# Patient Record
Sex: Male | Born: 2000 | Race: Black or African American | Hispanic: No | Marital: Single | Smoking: Current some day smoker
Health system: Southern US, Community
[De-identification: ages and names within clinical notes are randomized; demographics above are authoritative.]

## PROBLEM LIST (undated history)

## (undated) DIAGNOSIS — R32 Unspecified urinary incontinence: Secondary | ICD-10-CM

## (undated) DIAGNOSIS — J45909 Unspecified asthma, uncomplicated: Secondary | ICD-10-CM

## (undated) DIAGNOSIS — J302 Other seasonal allergic rhinitis: Secondary | ICD-10-CM

## (undated) DIAGNOSIS — F913 Oppositional defiant disorder: Secondary | ICD-10-CM

## (undated) DIAGNOSIS — R51 Headache: Secondary | ICD-10-CM

## (undated) DIAGNOSIS — F909 Attention-deficit hyperactivity disorder, unspecified type: Secondary | ICD-10-CM

## (undated) DIAGNOSIS — Y249XXA Unspecified firearm discharge, undetermined intent, initial encounter: Principal | ICD-10-CM

## (undated) HISTORY — DX: Headache: R51

## (undated) HISTORY — DX: Unspecified urinary incontinence: R32

## (undated) HISTORY — DX: Oppositional defiant disorder: F91.3

## (undated) HISTORY — PX: HUMERUS SURGERY: SHX672

## (undated) HISTORY — DX: Attention-deficit hyperactivity disorder, unspecified type: F90.9

---
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
## 2020-01-17 ENCOUNTER — Ambulatory Visit (INDEPENDENT_AMBULATORY_CARE_PROVIDER_SITE_OTHER): Payer: BC Managed Care – PPO | Admitting: Pediatrics

## 2020-01-17 ENCOUNTER — Other Ambulatory Visit: Payer: Self-pay

## 2020-01-17 ENCOUNTER — Telehealth (INDEPENDENT_AMBULATORY_CARE_PROVIDER_SITE_OTHER): Payer: BC Managed Care – PPO | Admitting: Pediatrics

## 2020-01-17 VITALS — BP 122/68 | HR 76 | Temp 98.3°F | Ht 71.0 in | Wt 144.8 lb

## 2020-01-17 DIAGNOSIS — R1013 Epigastric pain: Secondary | ICD-10-CM

## 2020-01-17 DIAGNOSIS — J4540 Moderate persistent asthma, uncomplicated: Secondary | ICD-10-CM | POA: Diagnosis not present

## 2020-01-17 MED ORDER — ALBUTEROL SULFATE HFA 108 (90 BASE) MCG/ACT IN AERS: INHALATION_SPRAY | RESPIRATORY_TRACT | 1 refills | Status: DC

## 2020-01-17 NOTE — Progress Notes (Signed)
Subjective:     John [name], is a 19 y.o. male   History provider by patient No interpreter necessary.  Chief Complaint  Patient presents with  . Abdominal Pain    x 3 days denies diarrhea and fever   . Emesis    x 3 days denies cough  . Medication Refill    HPI:  From earlier video visit:  "Has been having abdominal pain after eating spicy foods. Says the pain is in the middle of his stomach. Describes it as a cramping pain. Usually happens an hour after eating spicy foods. Doesn't happen after eating other foods. The pain will last for around 4 hours after he eats. He vomited at work 3 days ago. Says he likes eating John [name] food and uses a lot of hot sauce. Denies cough, fever, diarrhea, rhinorrhea. Says his throat has been sore as well over the past few days"  Patient did not have any additional information to provide during our in-person visit. Affirmed that the abdominal pain only occurs after eating spicy foods. Denied need for STI testing, had been previously tested in 2020. Asked if he could get a COVID swab and for his albuterol to be refilled.    Review of Systems  Constitutional: Negative for appetite change, fever and unexpected weight change.  HENT: Positive for sore throat. Negative for congestion, rhinorrhea and trouble swallowing.   Respiratory: Negative for cough and choking.   Gastrointestinal: Negative for abdominal pain, diarrhea, nausea and vomiting.  Genitourinary: Negative for difficulty urinating, hematuria, penile pain and testicular pain.  Skin: Negative for rash.     Patient's history was reviewed and updated as appropriate: allergies, current medications, past medical history and problem list.     Objective:     BP 122/68 (BP Location: Right Arm, Patient Position: Sitting)   Pulse 76   Temp 98.3 F (36.8 C) (Temporal)   Ht 5\' 11"  (1.803 m)   Wt 144 lb 12.8 oz (65.7 kg)   SpO2 99%   BMI 20.20 kg/m   Physical Exam Constitutional:        General: He is not in acute distress.    Appearance: He is well-developed and normal weight. He is not ill-appearing.  HENT:     Head: Normocephalic and atraumatic.     Mouth/Throat:     Mouth: Mucous membranes are moist.     Pharynx: Oropharynx is clear.  Eyes:     Extraocular Movements: Extraocular movements intact.  Cardiovascular:     Rate and Rhythm: Normal rate and regular rhythm.     Heart sounds: Normal heart sounds. No murmur.  Pulmonary:     Effort: Pulmonary effort is normal. No respiratory distress.     Breath sounds: Normal breath sounds. No wheezing.  Abdominal:     General: Abdomen is flat. Bowel sounds are normal. There is no distension.     Palpations: Abdomen is soft.     Tenderness: There is no abdominal tenderness.  Skin:    General: Skin is warm and dry.     Capillary Refill: Capillary refill takes less than 2 seconds.  Neurological:     Mental Status: He is alert.  Psychiatric:        Mood and Affect: Mood normal.        Assessment & Plan:   John [name] is a 19yo male with a past medical history of asthma who presents with complaints of abdominal pain after eating spicy food as well as  a sore throat. Denies other associated symptoms. Has had normal voiding/stooling. Denies GU pain. Discussed trialing a week off of spicy foods and avoiding other acidic foods/drinks as they are most likely irritating his stomach. Also discussed trying Tums to see if that helps. He was agreeable to this plan. He asked to be COVID swabbed, so a PCR test was sent. He mentioned his albuterol had run out, so refilled his prescriptions.   Supportive care and return precautions reviewed.  STI testing discussed, pt declined   Valetta Close, MD   ATTENDING ATTESTATION: I saw and evaluated the patient, performing the key elements of the service. I developed the management plan that is described in the resident's note, and I agree with the content.   Whitney Haddix                   01/18/2020, 3:37 PM

## 2020-01-17 NOTE — Patient Instructions (Signed)
Your stomach pain may be caused by irritation caused by spicy foods. I would recommend taking a week off of spicy foods to see if that helps. This may also be causing your throat pain. I would recommend taking TUMS after you eat which can help lessen the irritation in your stomach.

## 2020-01-17 NOTE — Progress Notes (Signed)
Virtual Visit via Video Note  I connected with John [name] on 01/17/20 at  8:40 AM EDT by a video enabled telemedicine application and verified that I am speaking with the correct person using two identifiers.  Location: Patient: at home in [facility] Provider: [facility]   I discussed the limitations of evaluation and management by telemedicine and the availability of in person appointments. The patient expressed understanding and agreed to proceed.  History of Present Illness: Has been having abdominal pain after eating spicy foods. Says the pain is in the middle of his stomach. Describes it as a cramping pain. Usually happens an hour after eating spicy foods. Doesn't happen after eating other foods. The pain will last for around 4 hours after he eats. He vomited at work 3 days ago. Says he likes eating [place] food and uses a lot of hot sauce.   Denies cough, fever, diarrhea, rhinorrhea. Says his throat has been sore as well over the past few days.     Observations/Objective: Sitting up in bed, was well-appearing. Speaking appropriately. Denies current abdominal pain.  Assessment and Plan: John [name] is a 19yo male with a history of asthma and ADHD who presents with abdominal pain that usually occurs after eating spicy foods. Also has had one episode of emesis and a sore throat for a couple of days. [place] denies other symptoms. Discussed eliminating spicy and acidic foods from diet as well as trying Tums. Bronte seemed hesitant and asked if he could be seen in person. A same-day in person visit was made for later this morning.   Follow Up Instructions:    I discussed the assessment and treatment plan with the patient. The patient was provided an opportunity to ask questions and all were answered. The patient agreed with the plan and demonstrated an understanding of the instructions.   The patient was advised to call back or seek an in-person evaluation if the symptoms  worsen or if the condition fails to improve as anticipated.  I provided 5 minutes of non-face-to-face time during this encounter.   Gerrie Nordmann, MD

## 2020-01-19 LAB — SARS-COV-2 RNA,(COVID-19) QUALITATIVE NAAT: SARS CoV2 RNA: DETECTED — AB

## 2020-01-23 ENCOUNTER — Telehealth: Payer: Self-pay | Admitting: Pediatrics

## 2020-01-23 NOTE — Telephone Encounter (Signed)
Spoke with Naheem re: positive COVID-19 result.  He did view result in myChart and has been able to isolate along with his household contacts.  Pt reports that he had subjective fever on either Monday 4/19 or Tuesday 4/20.  He also reports having body aches and loss of taste at some point, possibly Thursday 4/22.  Symptoms have all resolved.  Given report of subjective fever, I advised him that his last day of isolation is Friday 4/30.   He asked about returning for repeat testing and I informed him that repeat testing is not necessary unless he is 3 months out from his initial infection and has new sx consistent with COVID. I informed him that if he were to develop severe shortness of breath or chest pain that he should seek medical attention at an ED facility.  Kavir voiced understanding of this and I gave him the [facility] number to call if he needs a note for work, he does not need one currently.  Edwena Felty, MD 01/23/2020

## 2020-01-25 ENCOUNTER — Encounter: Payer: Self-pay | Admitting: Pediatrics

## 2020-01-25 ENCOUNTER — Telehealth: Payer: Self-pay

## 2020-01-25 NOTE — Telephone Encounter (Signed)
The patient tested positive for COVID on 01/17/20.  The earliest he can return to work is Monday 01/28/20.  In order to return to work he needs to have improving symptoms and no fever for at least 24 hours without fever-lowering medications.  Please call the patient to let him know and to check on his symptoms.  We can send the note via MyChart if he will meet criteria for return to work on Monday.

## 2020-01-25 NOTE — Telephone Encounter (Signed)
Letter printed given to CMA to notify patient and take to front desk.

## 2020-01-25 NOTE — Telephone Encounter (Signed)
Patient notified that work note is ready for pick up, he states that mom will be the one to come and get it.

## 2020-01-25 NOTE — Telephone Encounter (Signed)
Called and spoke with patient, his last fever was on Tuesday 4/27, he said he is feeing fine and has no more symptoms. He wants a hard copy printed and placed at the front desk for his mom to pick up this afternoon. He doesn't have a way to print it via MyChart.

## 2020-01-25 NOTE — Telephone Encounter (Signed)
Pts need a letter for work. Clearance of Covid 19

## 2020-02-11 ENCOUNTER — Encounter: Payer: Self-pay | Admitting: Pediatrics

## 2020-06-27 ENCOUNTER — Emergency Department (HOSPITAL_COMMUNITY)
Admission: EM | Admit: 2020-06-27 | Discharge: 2020-06-28 | Disposition: A | Discharge status: Home / Self Care | Payer: BC Managed Care – PPO | Attending: Emergency Medicine | Admitting: Emergency Medicine

## 2020-06-27 DIAGNOSIS — S161XXA Strain of muscle, fascia and tendon at neck level, initial encounter: Secondary | ICD-10-CM

## 2020-06-27 DIAGNOSIS — I712 Thoracic aortic aneurysm, without rupture: Secondary | ICD-10-CM | POA: Diagnosis not present

## 2020-06-27 DIAGNOSIS — S0990XA Unspecified injury of head, initial encounter: Secondary | ICD-10-CM | POA: Diagnosis present

## 2020-06-27 DIAGNOSIS — Y92410 Unspecified street and highway as the place of occurrence of the external cause: Secondary | ICD-10-CM | POA: Diagnosis not present

## 2020-06-27 DIAGNOSIS — S3991XA Unspecified injury of abdomen, initial encounter: Secondary | ICD-10-CM | POA: Diagnosis not present

## 2020-06-27 DIAGNOSIS — S42402A Unspecified fracture of lower end of left humerus, initial encounter for closed fracture: Secondary | ICD-10-CM

## 2020-06-27 DIAGNOSIS — S301XXA Contusion of abdominal wall, initial encounter: Secondary | ICD-10-CM

## 2020-06-27 DIAGNOSIS — Y9389 Activity, other specified: Secondary | ICD-10-CM | POA: Diagnosis not present

## 2020-06-27 DIAGNOSIS — F17210 Nicotine dependence, cigarettes, uncomplicated: Secondary | ICD-10-CM | POA: Diagnosis not present

## 2020-06-27 DIAGNOSIS — S0081XA Abrasion of other part of head, initial encounter: Secondary | ICD-10-CM | POA: Diagnosis not present

## 2020-06-27 DIAGNOSIS — J45909 Unspecified asthma, uncomplicated: Secondary | ICD-10-CM | POA: Diagnosis not present

## 2020-06-27 DIAGNOSIS — T1490XA Injury, unspecified, initial encounter: Secondary | ICD-10-CM

## 2020-06-27 MED ORDER — ONDANSETRON HCL 4 MG/2ML IJ SOLN
4.0000 mg | Freq: Once | INTRAMUSCULAR | Status: AC
  Administered 2020-06-28: 4 mg via INTRAVENOUS
  Filled 2020-06-27 (×2): qty 2

## 2020-06-27 MED ORDER — MORPHINE SULFATE (PF) 4 MG/ML IV SOLN
4.0000 mg | Freq: Once | INTRAVENOUS | Status: AC
  Administered 2020-06-28: 4 mg via INTRAVENOUS
  Filled 2020-06-27 (×2): qty 1

## 2020-06-27 MED ORDER — SODIUM CHLORIDE 0.9 % IV BOLUS
1000.0000 mL | Freq: Once | INTRAVENOUS | Status: AC
  Administered 2020-06-28: 1000 mL via INTRAVENOUS
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
## 2020-06-28 ENCOUNTER — Encounter (HOSPITAL_COMMUNITY): Payer: Self-pay | Admitting: Emergency Medicine

## 2020-06-28 ENCOUNTER — Telehealth: Payer: Self-pay

## 2020-06-28 ENCOUNTER — Emergency Department (HOSPITAL_COMMUNITY): Payer: BC Managed Care – PPO

## 2020-06-28 ENCOUNTER — Other Ambulatory Visit: Payer: Self-pay

## 2020-06-28 ENCOUNTER — Telehealth (HOSPITAL_COMMUNITY): Payer: Self-pay | Admitting: Emergency Medicine

## 2020-06-28 DIAGNOSIS — S0081XA Abrasion of other part of head, initial encounter: Secondary | ICD-10-CM | POA: Diagnosis not present

## 2020-06-28 LAB — CBC WITH DIFFERENTIAL/PLATELET
Abs Immature Granulocytes: 0.05 10*3/uL (ref 0.00–0.07)
Basophils Absolute: 0.1 10*3/uL (ref 0.0–0.1)
Basophils Relative: 1 %
Eosinophils Absolute: 0 10*3/uL (ref 0.0–0.5)
Eosinophils Relative: 0 %
HCT: 47 % (ref 39.0–52.0)
Hemoglobin: 15.1 g/dL (ref 13.0–17.0)
Immature Granulocytes: 1 %
Lymphocytes Relative: 9 %
Lymphs Abs: 0.9 10*3/uL (ref 0.7–4.0)
MCH: 26.6 pg (ref 26.0–34.0)
MCHC: 32.1 g/dL (ref 30.0–36.0)
MCV: 82.9 fL (ref 80.0–100.0)
Monocytes Absolute: 0.9 10*3/uL (ref 0.1–1.0)
Monocytes Relative: 9 %
Neutro Abs: 8.2 10*3/uL — ABNORMAL HIGH (ref 1.7–7.7)
Neutrophils Relative %: 80 %
Platelets: 312 10*3/uL (ref 150–400)
RBC: 5.67 MIL/uL (ref 4.22–5.81)
RDW: 11.5 % (ref 11.5–15.5)
WBC: 10.1 10*3/uL (ref 4.0–10.5)
nRBC: 0 % (ref 0.0–0.2)

## 2020-06-28 LAB — BASIC METABOLIC PANEL
Anion gap: 13 (ref 5–15)
BUN: 9 mg/dL (ref 6–20)
CO2: 25 mmol/L (ref 22–32)
Calcium: 9.6 mg/dL (ref 8.9–10.3)
Chloride: 97 mmol/L — ABNORMAL LOW (ref 98–111)
Creatinine, Ser: 0.94 mg/dL (ref 0.61–1.24)
GFR calc Af Amer: >60 mL/min (ref >60)
GFR calc non Af Amer: >60 mL/min (ref >60)
Glucose, Bld: 129 mg/dL — ABNORMAL HIGH (ref 70–99)
Potassium: 3.6 mmol/L (ref 3.5–5.1)
Sodium: 135 mmol/L (ref 135–145)

## 2020-06-28 MED ORDER — MORPHINE SULFATE (PF) 4 MG/ML IV SOLN
4.0000 mg | Freq: Once | INTRAVENOUS | Status: AC
  Administered 2020-06-28: 4 mg via INTRAVENOUS
  Filled 2020-06-28: qty 1

## 2020-06-28 MED ORDER — HYDROCODONE-ACETAMINOPHEN 5-325 MG PO TABS: 1.0000 | ORAL_TABLET | Freq: Four times a day (QID) | ORAL | 0 refills | Status: DC | PRN

## 2020-06-28 MED ORDER — IOHEXOL 300 MG/ML  SOLN
100.0000 mL | Freq: Once | INTRAMUSCULAR | Status: AC | PRN
  Administered 2020-06-28: 100 mL via INTRAVENOUS

## 2020-06-28 NOTE — Telephone Encounter (Signed)
Patient called because prescription for pain meds overnight was not transmitted to the pharmacy. Resent medication.

## 2020-06-28 NOTE — Progress Notes (Signed)
Orthopedic Tech Progress Note Patient Details:  John [name] [birth year] [id number]  Ortho Devices Type of Ortho Device: Long arm splint, Arm sling Ortho Device/Splint Location: Left Arm Ortho Device/Splint Interventions: Application   Post Interventions Patient Tolerated: Well Instructions Provided: Care of device   Marlies Ligman E Axelle Szwed 06/28/2020, 4:20 AM

## 2020-06-28 NOTE — Discharge Instructions (Addendum)
Wear splint as applied until followed up with orthopedics.  Take hydrocodone as prescribed as needed for pain.  Ice your arm for 20 minutes every 2 hours while awake for the next 2 days.  You are to follow-up with orthopedic surgery on Monday.  The contact information for Dr. Eulah Pont has been provided in this discharge summary for you to call and make these arrangements.

## 2020-06-28 NOTE — ED Notes (Signed)
Patient c/o severe pain in left arm positive left radial pulse able to move fingers.

## 2020-06-28 NOTE — ED Notes (Signed)
IV team at bedside 
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
## 2020-06-28 NOTE — ED Notes (Signed)
Pt's mom at bedside.

## 2020-06-28 NOTE — ED Notes (Signed)
Pt to xray at this time.
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
## 2020-06-28 NOTE — ED Notes (Signed)
IV attempted x's 2 without success by this RN   IV attempted by another RN without success.   Order placed for IV team

## 2020-07-04 ENCOUNTER — Other Ambulatory Visit: Payer: Self-pay

## 2020-07-04 ENCOUNTER — Encounter (HOSPITAL_BASED_OUTPATIENT_CLINIC_OR_DEPARTMENT_OTHER): Payer: Self-pay | Admitting: Orthopedic Surgery

## 2020-07-08 ENCOUNTER — Other Ambulatory Visit (HOSPITAL_COMMUNITY)
Admission: RE | Admit: 2020-07-08 | Discharge: 2020-07-08 | Disposition: A | Discharge status: Home / Self Care | Payer: BC Managed Care – PPO | Source: Ambulatory Visit | Attending: Orthopedic Surgery | Admitting: Orthopedic Surgery

## 2020-07-08 DIAGNOSIS — Z01812 Encounter for preprocedural laboratory examination: Secondary | ICD-10-CM | POA: Diagnosis present

## 2020-07-08 DIAGNOSIS — Z20822 Contact with and (suspected) exposure to covid-19: Secondary | ICD-10-CM | POA: Insufficient documentation

## 2020-07-08 LAB — SARS CORONAVIRUS 2 (TAT 6-24 HRS): SARS Coronavirus 2: NEGATIVE
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
## 2020-07-11 ENCOUNTER — Encounter (HOSPITAL_BASED_OUTPATIENT_CLINIC_OR_DEPARTMENT_OTHER): Payer: Self-pay | Admitting: Orthopedic Surgery

## 2020-07-11 ENCOUNTER — Other Ambulatory Visit: Payer: Self-pay

## 2020-07-11 ENCOUNTER — Ambulatory Visit (HOSPITAL_BASED_OUTPATIENT_CLINIC_OR_DEPARTMENT_OTHER)
Admission: RE | Admit: 2020-07-11 | Discharge: 2020-07-11 | Disposition: A | Discharge status: Home / Self Care | Payer: BC Managed Care – PPO | Attending: Orthopedic Surgery | Admitting: Orthopedic Surgery

## 2020-07-11 ENCOUNTER — Ambulatory Visit (HOSPITAL_BASED_OUTPATIENT_CLINIC_OR_DEPARTMENT_OTHER): Payer: BC Managed Care – PPO | Admitting: Anesthesiology

## 2020-07-11 ENCOUNTER — Encounter (HOSPITAL_BASED_OUTPATIENT_CLINIC_OR_DEPARTMENT_OTHER)
Admission: RE | Disposition: A | Discharge status: Home / Self Care | Payer: Self-pay | Source: Home / Self Care | Attending: Orthopedic Surgery

## 2020-07-11 DIAGNOSIS — S42412A Displaced simple supracondylar fracture without intercondylar fracture of left humerus, initial encounter for closed fracture: Secondary | ICD-10-CM | POA: Diagnosis present

## 2020-07-11 DIAGNOSIS — J45909 Unspecified asthma, uncomplicated: Secondary | ICD-10-CM | POA: Diagnosis not present

## 2020-07-11 DIAGNOSIS — F17290 Nicotine dependence, other tobacco product, uncomplicated: Secondary | ICD-10-CM | POA: Diagnosis not present

## 2020-07-11 DIAGNOSIS — S5422XA Injury of radial nerve at forearm level, left arm, initial encounter: Secondary | ICD-10-CM | POA: Insufficient documentation

## 2020-07-11 HISTORY — PX: ULNAR NERVE TRANSPOSITION: SHX2595

## 2020-07-11 HISTORY — PX: ORIF HUMERUS FRACTURE: SHX2126

## 2020-07-11 SURGERY — OPEN REDUCTION INTERNAL FIXATION (ORIF) DISTAL HUMERUS FRACTURE
Anesthesia: Regional | Site: Elbow | Laterality: Left

## 2020-07-11 MED ORDER — FENTANYL CITRATE (PF) 100 MCG/2ML IJ SOLN
INTRAMUSCULAR | Status: DC | PRN
Start: 2020-07-11 — End: 2020-07-11
  Administered 2020-07-11: 100 ug via INTRAVENOUS

## 2020-07-11 MED ORDER — ROCURONIUM BROMIDE 100 MG/10ML IV SOLN
INTRAVENOUS | Status: DC | PRN
  Administered 2020-07-11: 50 mg via INTRAVENOUS

## 2020-07-11 MED ORDER — POLYETHYLENE GLYCOL 3350 17 G PO PACK: 17.0000 g | PACK | Freq: Every day | ORAL | Status: DC | PRN

## 2020-07-11 MED ORDER — LIDOCAINE 2% (20 MG/ML) 5 ML SYRINGE
INTRAMUSCULAR | Status: AC
  Filled 2020-07-11: qty 5

## 2020-07-11 MED ORDER — FENTANYL CITRATE (PF) 100 MCG/2ML IJ SOLN
INTRAMUSCULAR | Status: AC
  Filled 2020-07-11: qty 2

## 2020-07-11 MED ORDER — ARTIFICIAL TEARS OPHTHALMIC OINT
TOPICAL_OINTMENT | OPHTHALMIC | Status: DC | PRN
  Administered 2020-07-11: 1 via OPHTHALMIC

## 2020-07-11 MED ORDER — DEXMEDETOMIDINE (PRECEDEX) IN NS 20 MCG/5ML (4 MCG/ML) IV SYRINGE
PREFILLED_SYRINGE | INTRAVENOUS | Status: AC
  Filled 2020-07-11: qty 10

## 2020-07-11 MED ORDER — GLYCOPYRROLATE 0.2 MG/ML IJ SOLN
INTRAMUSCULAR | Status: DC | PRN
  Administered 2020-07-11: .2 mg via INTRAVENOUS

## 2020-07-11 MED ORDER — ONDANSETRON HCL 4 MG/2ML IJ SOLN
INTRAMUSCULAR | Status: DC | PRN
  Administered 2020-07-11: 4 mg via INTRAVENOUS

## 2020-07-11 MED ORDER — MIDAZOLAM HCL 2 MG/2ML IJ SOLN
INTRAMUSCULAR | Status: DC | PRN
  Administered 2020-07-11 (×2): 1 mg via INTRAVENOUS

## 2020-07-11 MED ORDER — LIDOCAINE HCL (CARDIAC) PF 100 MG/5ML IV SOSY
PREFILLED_SYRINGE | INTRAVENOUS | Status: DC | PRN
  Administered 2020-07-11: 60 mg via INTRAVENOUS

## 2020-07-11 MED ORDER — SUGAMMADEX SODIUM 200 MG/2ML IV SOLN
INTRAVENOUS | Status: DC | PRN
  Administered 2020-07-11: 125 mg via INTRAVENOUS

## 2020-07-11 MED ORDER — DEXAMETHASONE SODIUM PHOSPHATE 10 MG/ML IJ SOLN
INTRAMUSCULAR | Status: DC | PRN
  Administered 2020-07-11: 10 mg via INTRAVENOUS

## 2020-07-11 MED ORDER — LACTATED RINGERS IV SOLN: INTRAVENOUS | Status: DC

## 2020-07-11 MED ORDER — GLYCOPYRROLATE 0.2 MG/ML IJ SOLN
INTRAMUSCULAR | Status: AC
  Filled 2020-07-11: qty 1

## 2020-07-11 MED ORDER — MIDAZOLAM HCL 2 MG/2ML IJ SOLN
INTRAMUSCULAR | Status: AC
  Filled 2020-07-11: qty 2

## 2020-07-11 MED ORDER — ONDANSETRON HCL 4 MG/2ML IJ SOLN: 4.0000 mg | Freq: Once | INTRAMUSCULAR | Status: DC | PRN

## 2020-07-11 MED ORDER — NAPROXEN 250 MG PO TABS: 250.0000 mg | ORAL_TABLET | Freq: Two times a day (BID) | ORAL | Status: DC

## 2020-07-11 MED ORDER — BUPIVACAINE LIPOSOME 1.3 % IJ SUSP
INTRAMUSCULAR | Status: DC | PRN
  Administered 2020-07-11: 10 mL via PERINEURAL

## 2020-07-11 MED ORDER — DEXMEDETOMIDINE (PRECEDEX) IN NS 20 MCG/5ML (4 MCG/ML) IV SYRINGE
PREFILLED_SYRINGE | INTRAVENOUS | Status: DC | PRN
  Administered 2020-07-11 (×5): 8 ug via INTRAVENOUS

## 2020-07-11 MED ORDER — ONDANSETRON HCL 4 MG PO TABS: 4.0000 mg | ORAL_TABLET | Freq: Four times a day (QID) | ORAL | Status: DC | PRN

## 2020-07-11 MED ORDER — ACETAMINOPHEN 500 MG PO TABS
1000.0000 mg | ORAL_TABLET | Freq: Once | ORAL | Status: AC
  Administered 2020-07-11: 1000 mg via ORAL

## 2020-07-11 MED ORDER — CEFAZOLIN SODIUM-DEXTROSE 2-4 GM/100ML-% IV SOLN
INTRAVENOUS | Status: AC
  Filled 2020-07-11: qty 100

## 2020-07-11 MED ORDER — CEFAZOLIN SODIUM-DEXTROSE 2-4 GM/100ML-% IV SOLN: 2.0000 g | Freq: Four times a day (QID) | INTRAVENOUS | Status: DC

## 2020-07-11 MED ORDER — ARTIFICIAL TEARS OPHTHALMIC OINT
TOPICAL_OINTMENT | OPHTHALMIC | Status: AC
  Filled 2020-07-11: qty 3.5

## 2020-07-11 MED ORDER — BISACODYL 10 MG RE SUPP: 10.0000 mg | Freq: Every day | RECTAL | Status: DC | PRN

## 2020-07-11 MED ORDER — FENTANYL CITRATE (PF) 100 MCG/2ML IJ SOLN
50.0000 ug | Freq: Once | INTRAMUSCULAR | Status: AC
  Administered 2020-07-11: 50 ug via INTRAVENOUS

## 2020-07-11 MED ORDER — HYDROMORPHONE HCL 1 MG/ML IJ SOLN: 0.2500 mg | INTRAMUSCULAR | Status: DC | PRN

## 2020-07-11 MED ORDER — EPHEDRINE 5 MG/ML INJ
INTRAVENOUS | Status: AC
  Filled 2020-07-11: qty 10

## 2020-07-11 MED ORDER — PROPOFOL 10 MG/ML IV BOLUS
INTRAVENOUS | Status: AC
  Filled 2020-07-11: qty 20

## 2020-07-11 MED ORDER — OXYCODONE HCL 5 MG PO TABS: 5.0000 mg | ORAL_TABLET | Freq: Once | ORAL | Status: DC | PRN

## 2020-07-11 MED ORDER — MIDAZOLAM HCL 2 MG/2ML IJ SOLN
2.0000 mg | Freq: Once | INTRAMUSCULAR | Status: AC
  Administered 2020-07-11: 2 mg via INTRAVENOUS

## 2020-07-11 MED ORDER — OXYCODONE-ACETAMINOPHEN 5-325 MG PO TABS
1.0000 | ORAL_TABLET | ORAL | 0 refills | Status: AC | PRN
Start: 2020-07-11 — End: 2021-07-11

## 2020-07-11 MED ORDER — BUPIVACAINE HCL (PF) 0.5 % IJ SOLN
INTRAMUSCULAR | Status: DC | PRN
  Administered 2020-07-11: 15 mL via PERINEURAL

## 2020-07-11 MED ORDER — EPHEDRINE SULFATE 50 MG/ML IJ SOLN
INTRAMUSCULAR | Status: DC | PRN
  Administered 2020-07-11: 5 mg via INTRAVENOUS
  Administered 2020-07-11: 10 mg via INTRAVENOUS
  Administered 2020-07-11: 5 mg via INTRAVENOUS

## 2020-07-11 MED ORDER — ONDANSETRON HCL 4 MG/2ML IJ SOLN
INTRAMUSCULAR | Status: AC
  Filled 2020-07-11: qty 2

## 2020-07-11 MED ORDER — METOCLOPRAMIDE HCL 5 MG/ML IJ SOLN: 5.0000 mg | Freq: Three times a day (TID) | INTRAMUSCULAR | Status: DC | PRN

## 2020-07-11 MED ORDER — CEFAZOLIN SODIUM-DEXTROSE 2-4 GM/100ML-% IV SOLN
2.0000 g | INTRAVENOUS | Status: AC
  Administered 2020-07-11: 2 g via INTRAVENOUS

## 2020-07-11 MED ORDER — ACETAMINOPHEN 325 MG PO TABS: 325.0000 mg | ORAL_TABLET | Freq: Four times a day (QID) | ORAL | Status: DC | PRN

## 2020-07-11 MED ORDER — OXYCODONE HCL 5 MG/5ML PO SOLN: 5.0000 mg | Freq: Once | ORAL | Status: DC | PRN

## 2020-07-11 MED ORDER — ONDANSETRON HCL 4 MG/2ML IJ SOLN: 4.0000 mg | Freq: Four times a day (QID) | INTRAMUSCULAR | Status: DC | PRN

## 2020-07-11 MED ORDER — ACETAMINOPHEN 500 MG PO TABS
ORAL_TABLET | ORAL | Status: AC
  Filled 2020-07-11: qty 2

## 2020-07-11 MED ORDER — DEXAMETHASONE SODIUM PHOSPHATE 10 MG/ML IJ SOLN
INTRAMUSCULAR | Status: AC
  Filled 2020-07-11: qty 1

## 2020-07-11 MED ORDER — ROCURONIUM BROMIDE 10 MG/ML (PF) SYRINGE
PREFILLED_SYRINGE | INTRAVENOUS | Status: AC
  Filled 2020-07-11: qty 10

## 2020-07-11 MED ORDER — OXYCODONE HCL 5 MG PO TABS: 10.0000 mg | ORAL_TABLET | ORAL | Status: DC | PRN

## 2020-07-11 MED ORDER — PROPOFOL 10 MG/ML IV BOLUS
INTRAVENOUS | Status: DC | PRN
  Administered 2020-07-11: 200 mg via INTRAVENOUS

## 2020-07-11 MED ORDER — METOCLOPRAMIDE HCL 5 MG PO TABS: 5.0000 mg | ORAL_TABLET | Freq: Three times a day (TID) | ORAL | Status: DC | PRN

## 2020-07-11 MED ORDER — HYDROMORPHONE HCL 1 MG/ML IJ SOLN: 0.5000 mg | INTRAMUSCULAR | Status: DC | PRN

## 2020-07-11 MED ORDER — OXYCODONE HCL 5 MG PO TABS: 5.0000 mg | ORAL_TABLET | ORAL | Status: DC | PRN

## 2020-07-11 SURGICAL SUPPLY — 76 items
BIT DRILL 2.6 (BIT) ×3 IMPLANT
BIT DRILL COUNTER SINK (DRILL) ×2 IMPLANT
BIT DRILL OVERDRILL 3.5X122 (BIT) ×3 IMPLANT
BLADE SURG 15 STRL LF DISP TIS (BLADE) ×2 IMPLANT
BLADE SURG 15 STRL SS (BLADE) ×3
BNDG ELASTIC 4X5.8 VLCR STR LF (GAUZE/BANDAGES/DRESSINGS) ×6 IMPLANT
BNDG ESMARK 4X9 LF (GAUZE/BANDAGES/DRESSINGS) ×3 IMPLANT
CANISTER SUCT 1200ML W/VALVE (MISCELLANEOUS) ×3 IMPLANT
CHLORAPREP W/TINT 26 (MISCELLANEOUS) ×3 IMPLANT
CLSR STERI-STRIP ANTIMIC 1/2X4 (GAUZE/BANDAGES/DRESSINGS) IMPLANT
COVER WAND RF STERILE (DRAPES) IMPLANT
CUFF TOURN SGL QUICK 18X4 (TOURNIQUET CUFF) ×3 IMPLANT
CUFF TOURN SGL QUICK 24 (TOURNIQUET CUFF)
CUFF TRNQT CYL 24X4X16.5-23 (TOURNIQUET CUFF) IMPLANT
DECANTER SPIKE VIAL GLASS SM (MISCELLANEOUS) IMPLANT
DRAPE EXTREMITY T 121X128X90 (DISPOSABLE) ×3 IMPLANT
DRAPE INCISE IOBAN 66X45 STRL (DRAPES) ×3 IMPLANT
DRAPE U-SHAPE 47X51 STRL (DRAPES) ×3 IMPLANT
DRILL COUNTER SINK (DRILL) ×3
DRSG PAD ABDOMINAL 8X10 ST (GAUZE/BANDAGES/DRESSINGS) ×3 IMPLANT
ELECT REM PT RETURN 9FT ADLT (ELECTROSURGICAL) ×3
ELECTRODE REM PT RTRN 9FT ADLT (ELECTROSURGICAL) ×2 IMPLANT
GAUZE SPONGE 4X4 12PLY STRL (GAUZE/BANDAGES/DRESSINGS) ×3 IMPLANT
GAUZE XEROFORM 1X8 LF (GAUZE/BANDAGES/DRESSINGS) IMPLANT
GLOVE BIO SURGEON STRL SZ7.5 (GLOVE) ×12 IMPLANT
GLOVE BIOGEL PI IND STRL 6.5 (GLOVE) ×2 IMPLANT
GLOVE BIOGEL PI IND STRL 7.0 (GLOVE) ×2 IMPLANT
GLOVE BIOGEL PI IND STRL 7.5 (GLOVE) ×2 IMPLANT
GLOVE BIOGEL PI IND STRL 8 (GLOVE) ×4 IMPLANT
GLOVE BIOGEL PI INDICATOR 6.5 (GLOVE) ×1
GLOVE BIOGEL PI INDICATOR 7.0 (GLOVE) ×1
GLOVE BIOGEL PI INDICATOR 7.5 (GLOVE) ×1
GLOVE BIOGEL PI INDICATOR 8 (GLOVE) ×2
GLOVE ECLIPSE 6.5 STRL STRAW (GLOVE) ×3 IMPLANT
GLOVE ORTHO TXT STRL SZ7.5 (GLOVE) IMPLANT
GOWN STRL REUS W/ TWL LRG LVL3 (GOWN DISPOSABLE) ×4 IMPLANT
GOWN STRL REUS W/ TWL XL LVL3 (GOWN DISPOSABLE) ×2 IMPLANT
GOWN STRL REUS W/TWL LRG LVL3 (GOWN DISPOSABLE) ×6
GOWN STRL REUS W/TWL XL LVL3 (GOWN DISPOSABLE) ×3
NEEDLE 1/2 CIR CATGUT .05X1.09 (NEEDLE) IMPLANT
NEEDLE HYPO 25X1 1.5 SAFETY (NEEDLE) IMPLANT
NS IRRIG 1000ML POUR BTL (IV SOLUTION) ×3 IMPLANT
PACK ARTHROSCOPY DSU (CUSTOM PROCEDURE TRAY) ×3 IMPLANT
PACK BASIN DAY SURGERY FS (CUSTOM PROCEDURE TRAY) ×3 IMPLANT
PAD CAST 4YDX4 CTTN HI CHSV (CAST SUPPLIES) ×2 IMPLANT
PADDING CAST ABS 4INX4YD NS (CAST SUPPLIES)
PADDING CAST ABS COTTON 4X4 ST (CAST SUPPLIES) IMPLANT
PADDING CAST COTTON 4X4 STRL (CAST SUPPLIES) ×3
PENCIL SMOKE EVACUATOR (MISCELLANEOUS) ×3 IMPLANT
PLATE POST LAT DIST LEFT 8H (Plate) ×3 IMPLANT
SCREW BONE 18X3.5 (Screw) ×3 IMPLANT
SCREW BONE 3.5X24MM (Screw) ×6 IMPLANT
SCREW BONE 3.5X30 FULL THREAD (Screw) ×9 IMPLANT
SCREW BONE FT 3.5X28 (Screw) ×9 IMPLANT
SCREW LOCK HEX T10 3.5X12 (Screw) ×6 IMPLANT
SLEEVE SCD COMPRESS KNEE MED (MISCELLANEOUS) ×3 IMPLANT
SLING ARM FOAM STRAP LRG (SOFTGOODS) ×6 IMPLANT
SPLINT FAST PLASTER 5X30 (CAST SUPPLIES)
SPLINT PLASTER CAST FAST 5X30 (CAST SUPPLIES) IMPLANT
SPONGE LAP 18X18 RF (DISPOSABLE) ×3 IMPLANT
SUCTION FRAZIER HANDLE 10FR (MISCELLANEOUS) ×3
SUCTION TUBE FRAZIER 10FR DISP (MISCELLANEOUS) ×2 IMPLANT
SUT ETHILON 2 0 FSLX (SUTURE) ×6 IMPLANT
SUT FIBERWIRE #2 38 T-5 BLUE (SUTURE)
SUT MNCRL AB 4-0 PS2 18 (SUTURE) IMPLANT
SUT MON AB 2-0 CT1 36 (SUTURE) IMPLANT
SUT VIC AB 0 CT1 27 (SUTURE)
SUT VIC AB 0 CT1 27XBRD ANBCTR (SUTURE) IMPLANT
SUT VIC AB 2-0 SH 27 (SUTURE)
SUT VIC AB 2-0 SH 27XBRD (SUTURE) IMPLANT
SUT VICRYL 3-0 CR8 SH (SUTURE) IMPLANT
SUTURE FIBERWR #2 38 T-5 BLUE (SUTURE) IMPLANT
SYR BULB EAR ULCER 3OZ GRN STR (SYRINGE) ×3 IMPLANT
TOWEL GREEN STERILE FF (TOWEL DISPOSABLE) ×3 IMPLANT
UNDERPAD 30X36 HEAVY ABSORB (UNDERPADS AND DIAPERS) ×3 IMPLANT
YANKAUER SUCT BULB TIP NO VENT (SUCTIONS) ×3 IMPLANT

## 2020-07-11 NOTE — Anesthesia Postprocedure Evaluation (Signed)
Anesthesia Post Note  Patient: John [name]  Procedure(s) Performed: OPEN REDUCTION INTERNAL FIXATION (ORIF) LEFT DISTAL HUMERUS FRACTURE (Left Arm Upper) ULNAR NERVE NEUROPLASTY (Left Elbow)     Patient location during evaluation: PACU Anesthesia Type: Regional and General Level of consciousness: awake and alert Pain management: pain level controlled Vital Signs Assessment: post-procedure vital signs reviewed and stable Respiratory status: spontaneous breathing, nonlabored ventilation, respiratory function stable and patient connected to nasal cannula oxygen Cardiovascular status: blood pressure returned to baseline and stable Postop Assessment: no apparent nausea or vomiting Anesthetic complications: no   No complications documented.  Last Vitals:  Vitals:   07/11/20 1500 07/11/20 1515  BP: 137/81 125/74  Pulse: 99 88  Resp: 12 14  Temp:    SpO2: 96% 98%    Last Pain:  Vitals:   07/11/20 1500  TempSrc:   PainSc: 0-No pain                 [name] [name]

## 2020-07-11 NOTE — Op Note (Signed)
07/11/2020  1:55 PM  PATIENT:  John [name]    PRE-OPERATIVE DIAGNOSIS:  LEFT ELBOW FRACTURE  POST-OPERATIVE DIAGNOSIS:  Same  PROCEDURE:  OPEN REDUCTION INTERNAL FIXATION (ORIF) LEFT DISTAL HUMERUS FRACTURE, ULNAR NERVE NEUROPLASTY  SURGEON:  Sheral Apley, MD  ASSISTANT: Daun Peacock, PA-C, he was present and scrubbed throughout the case, critical for completion in a timely fashion, and for retraction, instrumentation, and closure.   ANESTHESIA:   gen  PREOPERATIVE INDICATIONS:  John [name] is a  19 y.o. male with a diagnosis of LEFT ELBOW FRACTURE who failed conservative measures and elected for surgical management.    The risks benefits and alternatives were discussed with the patient preoperatively including but not limited to the risks of infection, bleeding, nerve injury, cardiopulmonary complications, the need for revision surgery, among others, and the patient was willing to proceed.  OPERATIVE IMPLANTS: stryker Lar[place]ie hum plate  OPERATIVE FINDINGS: unstable fx  BLOOD LOSS: min  COMPLICATIONS: nonoe  TOURNIQUET TIME:  OPERATIVE PROCEDURE:  Patient was identified in the preoperative holding area and site was marked by me He was transported to the operating theater and placed on the table in supine position taking care to pad all bony prominences. After a preincinduction time out anesthesia was induced. The left upper extremity was prepped and draped in normal sterile fashion and a pre-incision timeout was performed. He received ancef for preoperative antibiotics.   He was placed in the lateral position padding all bony prominences extra was placed.  I then made a posterior incision from the elbow of the posterior aspect of the arm.  I developed a plane along the lateral triceps.  I identified his radial nerve and freed this up from entrapment near the fracture site and proximally to allow plate placement.  This was a neuroplasty of his radial nerve  Next I  reduced his fracture and placed lag screws across it.  I placed 4 lag screws all of which had excellent bite.  I then selected a plate that I did adjust this plate slightly to fit a little more superior and had 3 solid screws proximally that were bicortical and 4 distally.  I again confirmed the nerve was not trapped under the plate I was able to visualize it coursing over superior to it as well as superficial to it.  I thoroughly irrigated closed the skin in layers long-arm splint was placed.  POST OPERATIVE PLAN: sling, mobilize for dvt px

## 2020-07-11 NOTE — Transfer of Care (Signed)
Immediate Anesthesia Transfer of Care Note  Patient: Willey Blade  Procedure(s) Performed: OPEN REDUCTION INTERNAL FIXATION (ORIF) LEFT DISTAL HUMERUS FRACTURE (Left Arm Upper) ULNAR NERVE NEUROPLASTY (Left Elbow)  Patient Location: PACU  Anesthesia Type:General and Regional  Level of Consciousness: awake, oriented and patient cooperative  Airway & Oxygen Therapy: Patient Spontanous Breathing and Patient connected to face mask oxygen  Post-op Assessment: Report given to RN, Post -op Vital signs reviewed and stable and Patient moving all extremities X 4  Post vital signs: Reviewed and stable  Last Vitals:  Vitals Value Taken Time  BP 144/85 07/11/20 1439  Temp    Pulse 128 07/11/20 1442  Resp 14 07/11/20 1442  SpO2 100 % 07/11/20 1442  Vitals shown include unvalidated device data.  Last Pain:  Vitals:   07/11/20 1205  TempSrc: Oral  PainSc: 7       Patients Stated Pain Goal: 4 (07/11/20 1205)  Complications: No complications documented.

## 2020-07-11 NOTE — Anesthesia Procedure Notes (Signed)
Anesthesia Regional Block: Supraclavicular block   Pre-Anesthetic Checklist: ,, timeout performed, Correct Patient, Correct Site, Correct Laterality, Correct Procedure, Correct Position, site marked, Risks and benefits discussed,  Surgical consent,  Pre-op evaluation,  At surgeon's request and post-op pain management  Laterality: Left  Prep: Dura Prep       Needles:  Injection technique: Single-shot  Needle Type: Echogenic Stimulator Needle     Needle Length: 4cm  Needle Gauge: 20     Additional Needles:   Procedures:,,,, ultrasound used (permanent image in chart),,,,  Narrative:  Start time: 07/11/2020 12:20 PM End time: 07/11/2020 12:24 PM Injection made incrementally with aspirations every 5 mL.  Performed by: Personally  Anesthesiologist: Atilano Median, DO  Additional Notes: Patient identified. Risks/Benefits/Options discussed with patient including but not limited to bleeding, infection, nerve damage, failed block, incomplete pain control. Patient expressed understanding and wished to proceed. All questions were answered. Sterile technique was used throughout the entire procedure. Please see nursing notes for vital signs. Aspirated in 5cc intervals with injection for negative confirmation. Patient was given instructions on fall risk and not to get out of bed. All questions and concerns addressed with instructions to call with any issues or inadequate analgesia.

## 2020-07-11 NOTE — Interval H&P Note (Signed)
History and Physical Interval Note:  07/11/2020 11:46 AM  John [name]  has presented today for surgery, with the diagnosis of LEFT ELBOW FRACTURE.  The various methods of treatment have been discussed with the patient and family. After consideration of risks, benefits and other options for treatment, the patient has consented to  Procedure(s): OPEN REDUCTION INTERNAL FIXATION (ORIF) LEFT DISTAL HUMERUS FRACTURE (Left) ULNAR NERVE NEUROPLASTY (Left) as a surgical intervention.  The patient's history has been reviewed, patient examined, no change in status, stable for surgery.  I have reviewed the patient's chart and labs.  Questions were answered to the patient's satisfaction.     Sheral Apley
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
## 2020-07-11 NOTE — Anesthesia Procedure Notes (Signed)
Performed by: Talissa Apple Corey, CRNA       

## 2020-07-11 NOTE — Anesthesia Procedure Notes (Signed)
Procedure Name: Intubation Date/Time: 07/11/2020 12:40 PM Performed by: Collier Bullock, CRNA Pre-anesthesia Checklist: Patient identified, Emergency Drugs available, Suction available and Patient being monitored Patient Re-evaluated:Patient Re-evaluated prior to induction Oxygen Delivery Method: Circle system utilized Preoxygenation: Pre-oxygenation with 100% oxygen Induction Type: IV induction Ventilation: Oral airway inserted - appropriate to patient size Laryngoscope Size: Mac and 4 Grade View: Grade I Tube type: Oral Tube size: 7.0 mm Number of attempts: 1 Airway Equipment and Method: Stylet Placement Confirmation: ETT inserted through vocal cords under direct vision,  positive ETCO2 and breath sounds checked- equal and bilateral Secured at: 22 cm Tube secured with: Tape Dental Injury: Teeth and Oropharynx as per pre-operative assessment

## 2020-07-11 NOTE — Discharge Instructions (Signed)
Keep arm elevated with ice as much as possible to reduce pain and swelling. If needed, you may increase pain medication for the first few days post op to 2 tablets every 4 hours.  Stop as needed pain medication as soon as you are able.  Diet: As you were doing prior to hospitalization   Shower:  You have a splint on, leave the splint in place and keep the splint dry with a plastic bag.  Dressing:  You have a splint - leave the splint in place and we will change your bandages during your first follow-up appointment.    Activity:  Increase activity slowly as tolerated, but follow the weight bearing instructions below.  The rules on driving is that you can not be taking narcotics while you drive, and you must feel in control of the vehicle.    Weight Bearing:  Non weight bearing affected arm.  Sling for comfort.   To prevent constipation: you may use a stool softener such as -  Colace (over the counter) 100 mg by mouth twice a day  Drink plenty of fluids (prune juice may be helpful) and high fiber foods Miralax (over the counter) for constipation as needed.    Itching:  If you experience itching with your medications, try taking only a single pain pill, or even half a pain pill at a time.  You can also use benadryl over the counter for itching or also to help with sleep.   Precautions:  If you experience chest pain or shortness of breath - call 911 immediately for transfer to the hospital emergency department!!  If you develop a fever greater that 101 F, purulent drainage from wound, increased redness or drainage from wound, or calf pain -- Call the office at [phone]                                         Follow- Up Appointment:  Please call for an appointment to be seen in 2 weeks [place] - [phone]   Post Anesthesia Home Care Instructions  Activity: Get plenty of rest for the remainder of the day. A responsible individual must stay with you for 24 hours following the  procedure.  For the next 24 hours, DO NOT: -Drive a car -Advertising copywriter -Drink alcoholic beverages -Take any medication unless instructed by your physician -Make any legal decisions or sign important papers.  Meals: Start with liquid foods such as gelatin or soup. Progress to regular foods as tolerated. Avoid greasy, spicy, heavy foods. If nausea and/or vomiting occur, drink only clear liquids until the nausea and/or vomiting subsides. Call your physician if vomiting continues.  Special Instructions/Symptoms: Your throat may feel dry or sore from the anesthesia or the breathing tube placed in your throat during surgery. If this causes discomfort, gargle with warm salt water. The discomfort should disappear within 24 hours.  If you had a scopolamine patch placed behind your ear for the management of post- operative nausea and/or vomiting:  1. The medication in the patch is effective for 72 hours, after which it should be removed.  Wrap patch in a tissue and discard in the trash. Wash hands thoroughly with soap and water. 2. You may remove the patch earlier than 72 hours if you experience unpleasant side effects which may include dry mouth, dizziness or visual disturbances. 3. Avoid touching the patch. Wash your hands  with soap and water after contact with the patch.    Regional Anesthesia Blocks  1. Numbness or the inability to move the "blocked" extremity may last from 3-48 hours after placement. The length of time depends on the medication injected and your individual response to the medication. If the numbness is not going away after 48 hours, call your surgeon.  2. The extremity that is blocked will need to be protected until the numbness is gone and the  Strength has returned. Because you cannot feel it, you will need to take extra care to avoid injury. Because it may be weak, you may have difficulty moving it or using it. You may not know what position it is in without looking at it  while the block is in effect.  3. For blocks in the legs and feet, returning to weight bearing and walking needs to be done carefully. You will need to wait until the numbness is entirely gone and the strength has returned. You should be able to move your leg and foot normally before you try and bear weight or walk. You will need someone to be with you when you first try to ensure you do not fall and possibly risk injury.  4. Bruising and tenderness at the needle site are common side effects and will resolve in a few days.  5. Persistent numbness or new problems with movement should be communicated to the surgeon or the [facility] [phone] [facility] [phone]).Information for Discharge Teaching: EXPAREL (bupivacaine liposome injectable suspension)   Your surgeon or anesthesiologist gave you EXPAREL(bupivacaine) to help control your pain after surgery.   EXPAREL is a local anesthetic that provides pain relief by numbing the tissue around the surgical site.  EXPAREL is designed to release pain medication over time and can control pain for up to 72 hours.  Depending on how you respond to EXPAREL, you may require less pain medication during your recovery.  Possible side effects:  Temporary loss of sensation or ability to move in the area where bupivacaine was injected.  Nausea, vomiting, constipation  Rarely, numbness and tingling in your mouth or lips, lightheadedness, or anxiety may occur.  Call your doctor right away if you think you may be experiencing any of these sensations, or if you have other questions regarding possible side effects.  Follow all other discharge instructions given to you by your surgeon or nurse. Eat a healthy diet and drink plenty of water or other fluids.  If you return to the hospital for any reason within 96 hours following the administration of EXPAREL, it is important for health care providers to know that you have  received this anesthetic. A teal colored band has been placed on your arm with the date, time and amount of EXPAREL you have received in order to alert and inform your health care providers. Please leave this armband in place for the full 96 hours following administration, and then you may remove the band.  May give Tylenol after 6pm, if needed.

## 2020-07-11 NOTE — Progress Notes (Signed)
Assisted Dr Nance Pew with left, ultrasound guided, supraclavicular block. Side rails up, monitors on throughout procedure. See vital signs in flow sheet. Tolerated Procedure well.

## 2020-07-14 ENCOUNTER — Telehealth: Payer: Self-pay

## 2020-07-14 NOTE — Addendum Note (Signed)
Addendum  created 07/14/20 1031 by Jylian Pappalardo, Jewel Baize, CRNA   Charge Capture section accepted
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
## 2020-07-15 ENCOUNTER — Encounter (HOSPITAL_BASED_OUTPATIENT_CLINIC_OR_DEPARTMENT_OTHER): Payer: Self-pay | Admitting: Orthopedic Surgery

## 2020-08-29 ENCOUNTER — Encounter: Payer: Self-pay | Admitting: Pediatrics

## 2024-04-04 ENCOUNTER — Ambulatory Visit (HOSPITAL_COMMUNITY)
Admission: EM | Admit: 2024-04-04 | Discharge: 2024-04-04 | Disposition: A | Discharge status: Home / Self Care | Attending: Psychiatry | Admitting: Psychiatry

## 2024-04-04 DIAGNOSIS — Z008 Encounter for other general examination: Secondary | ICD-10-CM | POA: Insufficient documentation

## 2024-04-04 NOTE — Discharge Summary (Signed)
 John [name] to be discharged Home per NP order. An After Visit Summary was printed and given to the patient by provider. Patient escorted out and discharged home via private auto.  [name]  04/04/2024 2:37 PM

## 2024-04-04 NOTE — ED Provider Notes (Signed)
 Behavioral Health Urgent Care Medical Screening Exam  Patient Name: John [name] MRN: [id number] Date of Evaluation: 04/04/24 Chief Complaint:   Diagnosis:  Final diagnoses:  Evaluation by psychiatric service required    History of Present illness: John [name] is a 23 y.o. male.   Patient presents to [facility] unaccompanied, reporting that he was sent by his probation officer for a psychiatric evaluation. Patient reports no previous psychiatric services but admits to hx of ADHD and used to take medications years ago. Reports that he was released from prison a month ago and his probation officer asked him to come and get evaluated as part of probation requirements. He reports no additional legal issues after release from prison. Denies substance use. Denies family hx of mental illness. Currently lives with his mother and has enough support.   Face-to-face evaluation performed by this clinician.  23 year-old male sitting in the assessment room, calm and cooperative. He appears healthy and well-nourished. Alert and oriented x 4. Eye contact is good. Thought process is clear, coherent and goal-directed. Does not appear to be preoccupied or responding to internal stimuli. Speech is clear and well articulated, with normal volume.  Patient denies suicidal ideations. Denies homicidal ideations. Denies auditory/visual hallucinations. Denies hx of self-harm behaviors. Denies hx of hospitalizations.  Reports he was sentenced for 3 years for robbery and was recently released. He reports not taking any medications. Denies medical issues. Reports that he eats and sleeps well.  Denies respiratory distress. Denies chest pain. Denies headache/dizziness. Denies nausea/vomiting. Denies back/abdominal pain.    Patient does not seem to be in any acute distress.   He is advised to reach out to crisis services as needed. Additional resources provided.   Flowsheet Row ED from 04/04/2024 in [facility]  C-SSRS RISK CATEGORY No Risk    Psychiatric Specialty Exam  Presentation  General Appearance:Casual  Eye Contact:Fair  Speech:Clear and Coherent  Speech Volume:Normal  Handedness:Right   Mood and Affect  Mood: Euthymic  Affect: Appropriate   Thought Process  Thought Processes: Coherent  Descriptions of Associations:Intact  Orientation:Full (Time, Place and Person)  Thought Content:WDL    Hallucinations:None  Ideas of Reference:None  Suicidal Thoughts:No  Homicidal Thoughts:No   Sensorium  Memory: Immediate Fair; Recent Fair; Remote Fair  Judgment: Fair  Insight: Fair   Art therapist  Concentration: Fair  Attention Span: Fair  Recall: [employer] of Knowledge: Fair  Language: Fair   Psychomotor Activity  Psychomotor Activity: Normal   Assets  Assets: Manufacturing systems engineer; Desire for Improvement; Physical Health   Sleep  Sleep: Good  Number of hours:  8   Physical Exam: Physical Exam Vitals and nursing note reviewed.  Constitutional:      Appearance: Normal appearance.  HENT:     Head: Normocephalic and atraumatic.     Right Ear: Tympanic membrane normal.     Left Ear: Tympanic membrane normal.     Nose: Nose normal.  Eyes:     Extraocular Movements: Extraocular movements intact.     Pupils: Pupils are equal, round, and reactive to light.  Cardiovascular:     Rate and Rhythm: Normal rate.     Pulses: Normal pulses.  Pulmonary:     Effort: Pulmonary effort is normal.  Musculoskeletal:        General: Normal range of motion.     Cervical back: Normal range of motion and neck supple.  Neurological:     General: No focal  deficit present.     Mental Status: He is alert and oriented to person, place, and time.    Review of Systems  Constitutional: Negative.   HENT: Negative.    Eyes: Negative.   Respiratory: Negative.    Cardiovascular: Negative.   Gastrointestinal: Negative.    Genitourinary: Negative.   Musculoskeletal: Negative.   Skin: Negative.   Neurological: Negative.   Endo/Heme/Allergies: Negative.   Psychiatric/Behavioral: Negative.     Blood pressure 134/79, pulse 79, temperature 98.9 F (37.2 C), temperature source Oral, resp. rate 20, SpO2 99%. There is no height or weight on file to calculate BMI.  Musculoskeletal: Strength & Muscle Tone: within normal limits Gait & Station: normal Patient leans: N/A   [facility] MSE Discharge Disposition for Follow up and Recommendations: Based on my evaluation the patient does not appear to have an emergency medical condition and can be discharged with resources and follow up care in outpatient services for Medication Management, Individual Therapy, and Group Therapy   Randall Bouquet, NP 04/04/2024, 2:17 PM
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
## 2024-04-04 NOTE — Discharge Instructions (Addendum)

## 2024-09-10 ENCOUNTER — Observation Stay (HOSPITAL_COMMUNITY): Admission: EM | Admit: 2024-09-10 | Discharge: 2024-09-17 | Attending: Surgery | Admitting: Surgery

## 2024-09-10 ENCOUNTER — Emergency Department (HOSPITAL_COMMUNITY)

## 2024-09-10 ENCOUNTER — Encounter (HOSPITAL_COMMUNITY): Admission: EM | Payer: Self-pay | Source: Home / Self Care | Attending: Emergency Medicine

## 2024-09-10 ENCOUNTER — Emergency Department (HOSPITAL_COMMUNITY): Admitting: Anesthesiology

## 2024-09-10 DIAGNOSIS — S36899A Unspecified injury of other intra-abdominal organs, initial encounter: Secondary | ICD-10-CM | POA: Insufficient documentation

## 2024-09-10 DIAGNOSIS — S32302A Unspecified fracture of left ilium, initial encounter for closed fracture: Secondary | ICD-10-CM | POA: Insufficient documentation

## 2024-09-10 DIAGNOSIS — S52302A Unspecified fracture of shaft of left radius, initial encounter for closed fracture: Secondary | ICD-10-CM | POA: Diagnosis not present

## 2024-09-10 DIAGNOSIS — Y249XXA Unspecified firearm discharge, undetermined intent, initial encounter: Principal | ICD-10-CM | POA: Insufficient documentation

## 2024-09-10 DIAGNOSIS — J45909 Unspecified asthma, uncomplicated: Secondary | ICD-10-CM | POA: Insufficient documentation

## 2024-09-10 DIAGNOSIS — T1490XA Injury, unspecified, initial encounter: Secondary | ICD-10-CM | POA: Diagnosis present

## 2024-09-10 HISTORY — DX: Unspecified asthma, uncomplicated: J45.909

## 2024-09-10 HISTORY — DX: Unspecified firearm discharge, undetermined intent, initial encounter: Y24.9XXA

## 2024-09-10 SURGERY — LAPAROTOMY, EXPLORATORY
Anesthesia: General

## 2024-09-10 MED ADMIN — Acetaminophen Tab 500 MG: 1000 mg | ORAL | NDC 50580045711

## 2024-09-10 MED ADMIN — Ibuprofen Tab 600 MG: 600 mg | ORAL | NDC 60687045711

## 2024-09-10 MED ADMIN — Ibuprofen Tab 600 MG: 600 mg | ORAL | NDC 60687044611

## 2024-09-10 MED ADMIN — Methocarbamol Tab 500 MG: 1000 mg | ORAL | NDC 70010075405

## 2024-09-10 MED ADMIN — Docusate Sodium Cap 100 MG: 100 mg | ORAL | NDC 00904718361

## 2024-09-10 MED ADMIN — Iohexol IV Soln 350 MG/ML: 75 mL | INTRAVENOUS | NDC 00407141490

## 2024-09-10 MED ADMIN — Iohexol Inj 300 MG/ML: 50 mL | NDC 00407141361

## 2024-09-10 MED ADMIN — Fentanyl Citrate PF Soln Prefilled Syringe 50 MCG/ML: 50 ug | INTRAVENOUS | NDC 63323080801

## 2024-09-10 MED ADMIN — Oxycodone HCl Tab 5 MG: 5 mg | ORAL | NDC 68084035411

## 2024-09-10 MED ADMIN — Tet Tox-Diph-Acell Pertuss Ad Inj 5-2-15.5 LF-LF-MCG/0.5ML: 0.5 mL | INTRAMUSCULAR | NDC 49281040089

## 2024-09-10 MED FILL — Docusate Sodium Cap 100 MG: 100.0000 mg | ORAL | Qty: 1 | Status: AC

## 2024-09-10 MED FILL — Acetaminophen Tab 500 MG: 1000.0000 mg | ORAL | Qty: 2 | Status: CN

## 2024-09-10 MED FILL — Acetaminophen Tab 500 MG: 1000.0000 mg | ORAL | Qty: 2 | Status: AC

## 2024-09-10 MED FILL — Ibuprofen Tab 600 MG: 600.0000 mg | ORAL | Qty: 1 | Status: AC

## 2024-09-11 MED ADMIN — Acetaminophen Tab 500 MG: 1000 mg | ORAL | NDC 50580045711

## 2024-09-11 MED ADMIN — Ibuprofen Tab 600 MG: 600 mg | ORAL | NDC 60687045711

## 2024-09-11 MED ADMIN — Oxycodone HCl Tab 5 MG: 10 mg | ORAL | NDC 10702001850

## 2024-09-11 MED ADMIN — Methocarbamol Tab 500 MG: 1000 mg | ORAL | NDC 70010075405

## 2024-09-11 MED ADMIN — Docusate Sodium Cap 100 MG: 100 mg | ORAL | NDC 00904718361

## 2024-09-11 MED ADMIN — Ondansetron HCl Inj 4 MG/2ML (2 MG/ML): 4 mg | INTRAVENOUS | NDC 00409475518

## 2024-09-11 MED ADMIN — Oxycodone HCl Tab 5 MG: 5 mg | ORAL | NDC 10702001850

## 2024-09-11 MED ADMIN — Hydromorphone HCl Inj 1 MG/ML: 1 mg | INTRAVENOUS | NDC 76045000901
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
## 2024-09-12 ENCOUNTER — Observation Stay (HOSPITAL_COMMUNITY)

## 2024-09-12 ENCOUNTER — Observation Stay (HOSPITAL_BASED_OUTPATIENT_CLINIC_OR_DEPARTMENT_OTHER)

## 2024-09-12 ENCOUNTER — Encounter (HOSPITAL_COMMUNITY): Admission: EM | Payer: Self-pay | Source: Home / Self Care | Attending: Emergency Medicine

## 2024-09-12 DIAGNOSIS — S52302A Unspecified fracture of shaft of left radius, initial encounter for closed fracture: Secondary | ICD-10-CM | POA: Diagnosis not present

## 2024-09-12 HISTORY — PX: ORIF RADIAL FRACTURE: SHX5113

## 2024-09-12 SURGERY — OPEN REDUCTION INTERNAL FIXATION (ORIF) RADIAL FRACTURE
Anesthesia: General | Site: Arm Lower | Laterality: Left

## 2024-09-12 MED ORDER — FENTANYL CITRATE (PF) 250 MCG/5ML IJ SOLN
INTRAMUSCULAR | Status: DC | PRN
  Administered 2024-09-12 (×3): 50 ug via INTRAVENOUS
  Administered 2024-09-12: 100 ug via INTRAVENOUS

## 2024-09-12 MED ADMIN — Acetaminophen Tab 500 MG: 1000 mg | ORAL | NDC 50580045711

## 2024-09-12 MED ADMIN — Ibuprofen Tab 600 MG: 600 mg | ORAL | NDC 60687045711

## 2024-09-12 MED ADMIN — Oxycodone HCl Tab 5 MG: 10 mg | ORAL | NDC 10702001850

## 2024-09-12 MED ADMIN — Oxycodone HCl Tab 5 MG: 10 mg | ORAL | NDC 00406055223

## 2024-09-12 MED ADMIN — Methocarbamol Tab 500 MG: 1000 mg | ORAL | NDC 70010075405

## 2024-09-12 MED ADMIN — Docusate Sodium Cap 100 MG: 100 mg | ORAL | NDC 00904718361

## 2024-09-12 MED ADMIN — Glycopyrrolate Inj 0.2 MG/ML: .1 mg | INTRAVENOUS | NDC 00143968225

## 2024-09-12 MED ADMIN — Cefazolin Sodium-Dextrose IV Solution 2 GM/100ML-4%: 2 g | INTRAVENOUS | NDC 00338350841

## 2024-09-12 MED ADMIN — Ondansetron HCl Inj 4 MG/2ML (2 MG/ML): 4 mg | INTRAVENOUS | NDC 60505613005

## 2024-09-12 MED ADMIN — Lidocaine HCl Local Soln Prefilled Syringe 100 MG/5ML (2%): 60 mg | INTRAVENOUS | NDC 70004072309

## 2024-09-12 MED ADMIN — Hydromorphone HCl Inj 1 MG/ML: .5 mg | INTRAVENOUS | NDC 76045000901

## 2024-09-12 MED ADMIN — Methocarbamol Tab 500 MG: 500 mg | ORAL | NDC 70010075405

## 2024-09-12 MED ADMIN — Ketamine HCl Inj 10 MG/ML: 30 mg | INTRAVENOUS | NDC 42023011310

## 2024-09-12 MED ADMIN — Midazolam HCl Inj PF 2 MG/2ML (Base Equivalent): 2 mg | INTRAVENOUS | NDC 00409000125

## 2024-09-12 MED ADMIN — PROPOFOL 200 MG/20ML IV EMUL: 200 mg | INTRAVENOUS | NDC 00069020910

## 2024-09-12 MED ADMIN — Fentanyl Citrate Preservative Free (PF) Inj 100 MCG/2ML: 50 ug | INTRAVENOUS | NDC 72572017001

## 2024-09-12 MED ADMIN — Vancomycin HCl For Inj 1000 MG: 1000 mg | NDC 16714030910

## 2024-09-12 MED ADMIN — Sodium Chloride Irrigation Soln 0.9%: 1000 mL | NDC 99999050048

## 2024-09-12 MED ADMIN — Dexamethasone Sod Phosphate Preservative Free Inj 10 MG/ML: 10 mg | INTRAVENOUS | NDC 25021005301

## 2024-09-12 MED ADMIN — Chlorhexidine Gluconate Soln 0.12%: 15 mL | OROMUCOSAL | NDC 00121089300

## 2024-09-12 MED ADMIN — Enoxaparin Sodium Inj Soln Pref Syr 30 MG/0.3ML: 30 mg | SUBCUTANEOUS | NDC 71288043280

## 2024-09-12 MED FILL — Enoxaparin Sodium Inj Soln Pref Syr 30 MG/0.3ML: 30.0000 mg | INTRAMUSCULAR | Qty: 0.3 | Status: AC

## 2024-09-12 SURGICAL SUPPLY — 46 items
BAG COUNTER SPONGE SURGICOUNT (BAG) ×1 IMPLANT
BIT DRILL QC SFS 2.5X170 (BIT) IMPLANT
BLADE CLIPPER SURG (BLADE) IMPLANT
BNDG COHESIVE 3X5 WHT NS (GAUZE/BANDAGES/DRESSINGS) IMPLANT
BNDG COHESIVE 4X5 WHT NS (GAUZE/BANDAGES/DRESSINGS) IMPLANT
BNDG COMPR ESMARK 4X3 LF (GAUZE/BANDAGES/DRESSINGS) ×1 IMPLANT
BNDG ELASTIC 3INX 5YD STR LF (GAUZE/BANDAGES/DRESSINGS) ×1 IMPLANT
BNDG ELASTIC 4X5.8 VLCR STR LF (GAUZE/BANDAGES/DRESSINGS) ×1 IMPLANT
BNDG GAUZE DERMACEA FLUFF 4 (GAUZE/BANDAGES/DRESSINGS) ×1 IMPLANT
COVER SURGICAL LIGHT HANDLE (MISCELLANEOUS) ×1 IMPLANT
CUFF TOURN SGL QUICK 18X4 (TOURNIQUET CUFF) IMPLANT
CUFF TRNQT CYL 24X4X16.5-23 (TOURNIQUET CUFF) IMPLANT
DRAPE C-ARM 35X43 STRL (DRAPES) IMPLANT
DRAPE U-SHAPE 47X51 STRL (DRAPES) ×1 IMPLANT
DRSG MEPITEL 4X7.2 (GAUZE/BANDAGES/DRESSINGS) IMPLANT
GAUZE SPONGE 4X4 12PLY STRL (GAUZE/BANDAGES/DRESSINGS) ×1 IMPLANT
GAUZE XEROFORM 1X8 LF (GAUZE/BANDAGES/DRESSINGS) ×1 IMPLANT
GLOVE BIO SURGEON STRL SZ 6.5 (GLOVE) ×1 IMPLANT
GLOVE BIO SURGEON STRL SZ7.5 (GLOVE) ×1 IMPLANT
GLOVE BIOGEL PI IND STRL 7.0 (GLOVE) ×1 IMPLANT
GLOVE BIOGEL PI IND STRL 7.5 (GLOVE) ×1 IMPLANT
GOWN STRL REUS W/ TWL LRG LVL3 (GOWN DISPOSABLE) ×1 IMPLANT
KIT BASIN OR (CUSTOM PROCEDURE TRAY) ×1 IMPLANT
KIT TURNOVER KIT B (KITS) ×1 IMPLANT
KWIRE 1.6X150 (WIRE) IMPLANT
MANIFOLD NEPTUNE II (INSTRUMENTS) ×1 IMPLANT
NEEDLE 22X1.5 STRL (OR ONLY) (MISCELLANEOUS) IMPLANT
PACK ORTHO EXTREMITY (CUSTOM PROCEDURE TRAY) ×1 IMPLANT
PAD ARMBOARD POSITIONER FOAM (MISCELLANEOUS) ×2 IMPLANT
PAD CAST 3X4 CTTN HI CHSV (CAST SUPPLIES) IMPLANT
PAD CAST 4YDX4 CTTN HI CHSV (CAST SUPPLIES) ×1 IMPLANT
PROSTHESIS LCP PLATE 9H 124M (Plate) IMPLANT
SCREW LOCK CORT ST 3.5X18 (Screw) IMPLANT
SCREW LOCK CORT ST 3.5X20 (Screw) IMPLANT
SCREW LOCK CORT ST 3.5X22 (Screw) IMPLANT
SCREW LOCK CORT ST 3.5X24 (Screw) IMPLANT
SOLN 0.9% NACL POUR BTL 1000ML (IV SOLUTION) ×1 IMPLANT
SOLN STERILE WATER BTL 1000 ML (IV SOLUTION) ×1 IMPLANT
SPIKE FLUID TRANSFER (MISCELLANEOUS) ×1 IMPLANT
SUT ETHILON 3 0 PS 1 (SUTURE) IMPLANT
SUT MON AB 2-0 CT1 36 (SUTURE) IMPLANT
SYR CONTROL 10ML LL (SYRINGE) ×1 IMPLANT
TOWEL GREEN STERILE (TOWEL DISPOSABLE) ×1 IMPLANT
TOWEL GREEN STERILE FF (TOWEL DISPOSABLE) ×1 IMPLANT
TUBE CONNECTING 12X1/4 (SUCTIONS) ×1 IMPLANT
YANKAUER SUCT BULB TIP NO VENT (SUCTIONS) IMPLANT

## 2024-09-13 MED ORDER — OXYCODONE HCL 10 MG PO TABS: 10.0000 mg | ORAL_TABLET | ORAL | 0 refills | Status: AC | PRN

## 2024-09-13 MED ADMIN — Bacitracin Zinc Oint 500 Unit/GM: 31.5 | TOPICAL | NDC 58980001110

## 2024-09-13 MED ADMIN — Acetaminophen Tab 500 MG: 1000 mg | ORAL | NDC 50580045711

## 2024-09-13 MED ADMIN — Ibuprofen Tab 600 MG: 600 mg | ORAL | NDC 60687045711

## 2024-09-13 MED ADMIN — Oxycodone HCl Tab 5 MG: 15 mg | ORAL | NDC 00406055223

## 2024-09-13 MED ADMIN — Oxycodone HCl Tab 5 MG: 10 mg | ORAL | NDC 00406055223

## 2024-09-13 MED ADMIN — Oxycodone HCl Tab 5 MG: 10 mg | ORAL | NDC 10702001850

## 2024-09-13 MED ADMIN — Methocarbamol Tab 500 MG: 1000 mg | ORAL | NDC 70010075405

## 2024-09-13 MED ADMIN — Docusate Sodium Cap 100 MG: 100 mg | ORAL | NDC 00904718361

## 2024-09-13 MED ADMIN — Polyethylene Glycol 3350 Oral Packet 17 GM: 17 g | ORAL | NDC 00904693186

## 2024-09-13 MED ADMIN — Enoxaparin Sodium Inj Soln Pref Syr 30 MG/0.3ML: 30 mg | SUBCUTANEOUS | NDC 71288043280

## 2024-09-13 MED ADMIN — Cefazolin Sodium-Dextrose IV Solution 2 GM/100ML-4%: 2 g | INTRAVENOUS | NDC 00338350841

## 2024-09-13 MED ADMIN — Gabapentin Cap 100 MG: 100 mg | ORAL | NDC 16714066102

## 2024-09-13 MED ADMIN — Ergocalciferol Cap 1.25 MG (50000 Unit): 50000 [IU] | ORAL | NDC 42806054701

## 2024-09-13 MED FILL — Polyethylene Glycol 3350 Oral Packet 17 GM: 17.0000 g | ORAL | Qty: 1 | Status: AC

## 2024-09-13 MED FILL — Oxycodone HCl Tab 5 MG: 10.0000 mg | ORAL | Qty: 3 | Status: AC

## 2024-09-14 MED ADMIN — Bacitracin Zinc Oint 500 Unit/GM: 31.5 | TOPICAL | NDC 58980001110

## 2024-09-14 MED ADMIN — Acetaminophen Tab 500 MG: 1000 mg | ORAL | NDC 50580045711

## 2024-09-14 MED ADMIN — Ibuprofen Tab 600 MG: 600 mg | ORAL | NDC 60687045711

## 2024-09-14 MED ADMIN — Oxycodone HCl Tab 5 MG: 15 mg | ORAL | NDC 00406055223

## 2024-09-14 MED ADMIN — Methocarbamol Tab 500 MG: 1000 mg | ORAL | NDC 70010075405

## 2024-09-14 MED ADMIN — Docusate Sodium Cap 100 MG: 100 mg | ORAL | NDC 00904718361

## 2024-09-14 MED ADMIN — Polyethylene Glycol 3350 Oral Packet 17 GM: 17 g | ORAL | NDC 00904693186

## 2024-09-14 MED ADMIN — Enoxaparin Sodium Inj Soln Pref Syr 30 MG/0.3ML: 30 mg | SUBCUTANEOUS | NDC 71288043280

## 2024-09-14 MED ADMIN — Gabapentin Cap 100 MG: 100 mg | ORAL | NDC 16714066102

## 2024-09-15 MED ADMIN — Bacitracin Zinc Oint 500 Unit/GM: 31.5 | TOPICAL | NDC 58980001110

## 2024-09-15 MED ADMIN — Acetaminophen Tab 500 MG: 1000 mg | ORAL | NDC 50580045711

## 2024-09-15 MED ADMIN — Ibuprofen Tab 600 MG: 600 mg | ORAL | NDC 60687045711

## 2024-09-15 MED ADMIN — Gabapentin Cap 300 MG: 300 mg | ORAL | NDC 16714066202

## 2024-09-15 MED ADMIN — Oxycodone HCl Tab 5 MG: 15 mg | ORAL | NDC 00406055223

## 2024-09-15 MED ADMIN — Methocarbamol Tab 500 MG: 1000 mg | ORAL | NDC 70010075405

## 2024-09-15 MED ADMIN — Docusate Sodium Cap 100 MG: 100 mg | ORAL | NDC 00904718361

## 2024-09-15 MED ADMIN — Polyethylene Glycol 3350 Oral Packet 17 GM: 17 g | ORAL | NDC 00904693186

## 2024-09-15 MED ADMIN — Enoxaparin Sodium Inj Soln Pref Syr 30 MG/0.3ML: 30 mg | SUBCUTANEOUS | NDC 71288043280

## 2024-09-16 MED ADMIN — Bacitracin Zinc Oint 500 Unit/GM: 31.5 | TOPICAL | NDC 58980001110

## 2024-09-16 MED ADMIN — Acetaminophen Tab 500 MG: 1000 mg | ORAL | NDC 50580045711

## 2024-09-16 MED ADMIN — Ibuprofen Tab 600 MG: 600 mg | ORAL | NDC 60687045711

## 2024-09-16 MED ADMIN — Gabapentin Cap 300 MG: 300 mg | ORAL | NDC 16714066202

## 2024-09-16 MED ADMIN — Oxycodone HCl Tab 5 MG: 15 mg | ORAL | NDC 00406055223

## 2024-09-16 MED ADMIN — Methocarbamol Tab 500 MG: 1000 mg | ORAL | NDC 70010075405

## 2024-09-16 MED ADMIN — Docusate Sodium Cap 100 MG: 100 mg | ORAL | NDC 00904718361

## 2024-09-16 MED ADMIN — Polyethylene Glycol 3350 Oral Packet 17 GM: 17 g | ORAL | NDC 00904693186

## 2024-09-16 MED ADMIN — Enoxaparin Sodium Inj Soln Pref Syr 30 MG/0.3ML: 30 mg | SUBCUTANEOUS | NDC 71288043280

## 2024-09-16 MED ADMIN — Gabapentin Cap 300 MG: 600 mg | ORAL | NDC 16714066202

## 2024-09-16 MED FILL — Methocarbamol Tab 500 MG: 1000.0000 mg | ORAL | Qty: 2 | Status: AC

## 2024-09-16 MED FILL — Gabapentin Cap 300 MG: 600.0000 mg | ORAL | Qty: 2 | Status: AC

## 2024-09-17 ENCOUNTER — Encounter (HOSPITAL_BASED_OUTPATIENT_CLINIC_OR_DEPARTMENT_OTHER): Payer: Self-pay | Admitting: Orthopedic Surgery

## 2024-09-17 MED ORDER — METHOCARBAMOL 1000 MG PO TABS: 1000.0000 mg | ORAL_TABLET | Freq: Four times a day (QID) | ORAL | Status: AC

## 2024-09-17 MED ORDER — GABAPENTIN 100 MG PO CAPS: 100.0000 mg | ORAL_CAPSULE | Freq: Three times a day (TID) | ORAL | Status: AC

## 2024-09-17 MED ORDER — VITAMIN D (ERGOCALCIFEROL) 1.25 MG (50000 UNIT) PO CAPS: 50000.0000 [IU] | ORAL_CAPSULE | ORAL | Status: AC

## 2024-09-17 MED ADMIN — Bacitracin Zinc Oint 500 Unit/GM: 31.5 | TOPICAL | NDC 58980001110

## 2024-09-17 MED ADMIN — Acetaminophen Tab 500 MG: 1000 mg | ORAL | NDC 50580045711

## 2024-09-17 MED ADMIN — Ibuprofen Tab 600 MG: 600 mg | ORAL | NDC 60687045711

## 2024-09-17 MED ADMIN — Oxycodone HCl Tab 5 MG: 15 mg | ORAL | NDC 00406055223

## 2024-09-17 MED ADMIN — Docusate Sodium Cap 100 MG: 100 mg | ORAL | NDC 00904718361

## 2024-09-17 MED ADMIN — Polyethylene Glycol 3350 Oral Packet 17 GM: 17 g | ORAL | NDC 00904693186

## 2024-09-17 MED ADMIN — Enoxaparin Sodium Inj Soln Pref Syr 30 MG/0.3ML: 30 mg | SUBCUTANEOUS | NDC 71288043280

## 2024-09-17 MED ADMIN — Methocarbamol Tab 500 MG: 1000 mg | ORAL | NDC 70010075405

## 2024-09-17 MED ADMIN — Gabapentin Cap 300 MG: 600 mg | ORAL | NDC 16714066202

## 2024-09-17 NOTE — Progress Notes (Signed)
"   Norine Alberts to be D/C'd  per MD order.  Discussed with the patient and all questions fully answered.  VSS, Skin clean, dry and intact with the chart wounds in LDA.   IV catheter discontinued intact. Site without signs and symptoms of complications. Dressing and pressure applied.  An After Visit Summary was printed and given to the [employer].  "
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
